# Patient Record
Sex: Male | Born: 1960
Health system: Southern US, Community
[De-identification: ages and names within clinical notes are randomized; demographics above are authoritative.]

---

## 2002-10-24 ENCOUNTER — Emergency Department (HOSPITAL_COMMUNITY): Admission: EM | Admit: 2002-10-24 | Discharge: 2002-10-24 | Payer: Self-pay | Admitting: Emergency Medicine

## 2002-10-24 ENCOUNTER — Encounter: Payer: Self-pay | Admitting: Emergency Medicine

## 2007-04-15 ENCOUNTER — Emergency Department (HOSPITAL_COMMUNITY): Admission: EM | Admit: 2007-04-15 | Discharge: 2007-04-15 | Payer: Self-pay | Admitting: Emergency Medicine

## 2010-09-11 ENCOUNTER — Emergency Department (HOSPITAL_COMMUNITY): Admission: EM | Admit: 2010-09-11 | Discharge: 2010-09-11 | Payer: Self-pay | Admitting: Emergency Medicine

## 2011-01-10 LAB — COMPREHENSIVE METABOLIC PANEL
ALT: 17 U/L (ref 0–53)
AST: 20 U/L (ref 0–37)
Albumin: 4.2 g/dL (ref 3.5–5.2)
Alkaline Phosphatase: 56 U/L (ref 39–117)
BUN: 16 mg/dL (ref 6–23)
CO2: 24 mEq/L (ref 19–32)
Calcium: 9.3 mg/dL (ref 8.4–10.5)
Chloride: 106 mEq/L (ref 96–112)
Creatinine, Ser: 0.94 mg/dL (ref 0.4–1.5)
GFR calc Af Amer: >60 mL/min (ref >60)
GFR calc non Af Amer: >60 mL/min (ref >60)
Glucose, Bld: 119 mg/dL — ABNORMAL HIGH (ref 70–99)
Potassium: 3 mEq/L — ABNORMAL LOW (ref 3.5–5.1)
Sodium: 142 mEq/L (ref 135–145)
Total Bilirubin: 0.3 mg/dL (ref 0.3–1.2)
Total Protein: 7.1 g/dL (ref 6.0–8.3)

## 2011-01-10 LAB — URINE MICROSCOPIC-ADD ON

## 2011-01-10 LAB — ETHANOL: Alcohol, Ethyl (B): 270 mg/dL — ABNORMAL HIGH (ref 0–10)

## 2011-01-10 LAB — DIFFERENTIAL
Basophils Absolute: 0 10*3/uL (ref 0.0–0.1)
Eosinophils Relative: 1 % (ref 0–5)
Lymphocytes Relative: 47 % — ABNORMAL HIGH (ref 12–46)
Lymphs Abs: 3.2 10*3/uL (ref 0.7–4.0)
Neutro Abs: 3 10*3/uL (ref 1.7–7.7)
Neutrophils Relative %: 45 % (ref 43–77)

## 2011-01-10 LAB — CBC
HCT: 42.7 % (ref 39.0–52.0)
Hemoglobin: 13.9 g/dL (ref 13.0–17.0)
MCH: 28 pg (ref 26.0–34.0)
MCHC: 32.6 g/dL (ref 30.0–36.0)
MCV: 85.9 fL (ref 78.0–100.0)
Platelets: 270 10*3/uL (ref 150–400)
RBC: 4.97 MIL/uL (ref 4.22–5.81)
RDW: 12.8 % (ref 11.5–15.5)
WBC: 6.8 10*3/uL (ref 4.0–10.5)

## 2011-01-10 LAB — RAPID URINE DRUG SCREEN, HOSP PERFORMED
Amphetamines: NOT DETECTED
Cocaine: NOT DETECTED
Opiates: NOT DETECTED
Tetrahydrocannabinol: NOT DETECTED

## 2011-01-10 LAB — URINALYSIS, ROUTINE W REFLEX MICROSCOPIC
Glucose, UA: NEGATIVE mg/dL
Ketones, ur: NEGATIVE mg/dL
Leukocytes, UA: NEGATIVE
Nitrite: NEGATIVE
pH: 5 (ref 5.0–8.0)

## 2013-01-10 ENCOUNTER — Other Ambulatory Visit: Payer: Self-pay | Admitting: Neurology

## 2013-01-10 DIAGNOSIS — R42 Dizziness and giddiness: Secondary | ICD-10-CM

## 2013-01-13 ENCOUNTER — Other Ambulatory Visit: Payer: Self-pay | Admitting: Neurology

## 2013-01-13 DIAGNOSIS — R42 Dizziness and giddiness: Secondary | ICD-10-CM

## 2013-01-15 ENCOUNTER — Ambulatory Visit (INDEPENDENT_AMBULATORY_CARE_PROVIDER_SITE_OTHER): Payer: BC Managed Care – PPO

## 2013-01-15 DIAGNOSIS — R42 Dizziness and giddiness: Secondary | ICD-10-CM

## 2013-01-16 ENCOUNTER — Telehealth: Payer: Self-pay | Admitting: Neurology

## 2013-01-16 MED ORDER — GADOPENTETATE DIMEGLUMINE 469.01 MG/ML IV SOLN: 17.0000 mL | Freq: Once | INTRAVENOUS | Status: AC | PRN

## 2013-01-16 NOTE — Procedures (Addendum)
[facility]  NEUROIMAGING REPORT   STUDY DATE: 01/15/13 PATIENT NAME: Troy Goodwin DOB: [birth year] MRN: [id number]  ORDERING CLINICIAN: Levert Feinstein, MD CLINICAL HISTORY: 52 year old male with dizziness. Remote history of right Bell's palsy.  EXAM: MRI brain (with and without)  TECHNIQUE: MRI of the brain with and without contrast was obtained utilizing 5 mm axial slices with T1, T2, T2 flair, T2 star gradient echo and diffusion weighted views.  T1 sagittal, T2 coronal and postcontrast views in the axial and coronal plane were obtained. CONTRAST: no IMAGING SITE: [facility] [address]   FINDINGS:  No abnormal lesions are seen on diffusion-weighted views to suggest acute ischemia. The cortical sulci, fissures and cisterns are normal in size and appearance. Lateral, third and fourth ventricle are normal in size and appearance. Minimal asymmetry of the lateral ventricles (right smaller than left) likely a normal variant. No extra-axial fluid collections are seen. No evidence of mass effect or midline shift.  No abnormal lesions are seen on post contrast views.    On sagittal views the posterior fossa, pituitary gland and corpus callosum are unremarkable. No evidence of intracranial hemorrhage on gradient-echo views. The orbits and their contents, paranasal sinuses and calvarium are unremarkable.  Intracranial flow voids are present.  IMPRESSION:  Normal MRI brain (with and without). No change from CT head (without) on 04/15/07.   INTERPRETING PHYSICIAN:  Suanne Marker, MD Certified in Neurology, Neurophysiology and Neuroimaging  [facility] [address] [place], [place] [address] [phone]

## 2013-01-16 NOTE — Telephone Encounter (Signed)
Please call patient for normal MRI of brain 

## 2013-01-17 NOTE — Telephone Encounter (Signed)
Tried calling both numbers were disconnected. Will send letter to notify patient of results.

## 2013-02-24 ENCOUNTER — Ambulatory Visit: Payer: BC Managed Care – PPO | Admitting: Neurology

## 2015-04-20 ENCOUNTER — Encounter (HOSPITAL_BASED_OUTPATIENT_CLINIC_OR_DEPARTMENT_OTHER): Payer: Self-pay | Admitting: Emergency Medicine

## 2015-04-20 ENCOUNTER — Emergency Department (HOSPITAL_BASED_OUTPATIENT_CLINIC_OR_DEPARTMENT_OTHER)
Admission: EM | Admit: 2015-04-20 | Discharge: 2015-04-20 | Disposition: A | Discharge status: Home / Self Care | Payer: BLUE CROSS/BLUE SHIELD | Attending: Emergency Medicine | Admitting: Emergency Medicine

## 2015-04-20 ENCOUNTER — Emergency Department (HOSPITAL_BASED_OUTPATIENT_CLINIC_OR_DEPARTMENT_OTHER): Payer: BLUE CROSS/BLUE SHIELD

## 2015-04-20 DIAGNOSIS — K273 Acute peptic ulcer, site unspecified, without hemorrhage or perforation: Secondary | ICD-10-CM | POA: Diagnosis not present

## 2015-04-20 DIAGNOSIS — K2970 Gastritis, unspecified, without bleeding: Secondary | ICD-10-CM | POA: Insufficient documentation

## 2015-04-20 DIAGNOSIS — R109 Unspecified abdominal pain: Secondary | ICD-10-CM | POA: Diagnosis present

## 2015-04-20 DIAGNOSIS — K279 Peptic ulcer, site unspecified, unspecified as acute or chronic, without hemorrhage or perforation: Secondary | ICD-10-CM

## 2015-04-20 LAB — CBC WITH DIFFERENTIAL/PLATELET
BASOS ABS: 0 10*3/uL (ref 0.0–0.1)
BASOS PCT: 0 % (ref 0–1)
EOS ABS: 0 10*3/uL (ref 0.0–0.7)
Eosinophils Relative: 1 % (ref 0–5)
HEMATOCRIT: 41.9 % (ref 39.0–52.0)
HEMOGLOBIN: 14.1 g/dL (ref 13.0–17.0)
Lymphocytes Relative: 27 % (ref 12–46)
Lymphs Abs: 1.8 10*3/uL (ref 0.7–4.0)
MCH: 29.6 pg (ref 26.0–34.0)
MCHC: 33.7 g/dL (ref 30.0–36.0)
MCV: 88 fL (ref 78.0–100.0)
MONO ABS: 0.5 10*3/uL (ref 0.1–1.0)
MONOS PCT: 8 % (ref 3–12)
NEUTROS PCT: 64 % (ref 43–77)
Neutro Abs: 4.4 10*3/uL (ref 1.7–7.7)
PLATELETS: 265 10*3/uL (ref 150–400)
RBC: 4.76 MIL/uL (ref 4.22–5.81)
RDW: 12.8 % (ref 11.5–15.5)
WBC: 6.8 10*3/uL (ref 4.0–10.5)

## 2015-04-20 LAB — COMPREHENSIVE METABOLIC PANEL
ALBUMIN: 4.4 g/dL (ref 3.5–5.0)
ALT: 17 U/L (ref 17–63)
AST: 20 U/L (ref 15–41)
Alkaline Phosphatase: 44 U/L (ref 38–126)
Anion gap: 9 (ref 5–15)
BILIRUBIN TOTAL: 0.7 mg/dL (ref 0.3–1.2)
BUN: 18 mg/dL (ref 6–20)
CHLORIDE: 104 mmol/L (ref 101–111)
CO2: 27 mmol/L (ref 22–32)
CREATININE: 0.97 mg/dL (ref 0.61–1.24)
Calcium: 9.3 mg/dL (ref 8.9–10.3)
GFR calc Af Amer: >60 mL/min (ref >60)
GFR calc non Af Amer: >60 mL/min (ref >60)
Glucose, Bld: 95 mg/dL (ref 65–99)
POTASSIUM: 3.9 mmol/L (ref 3.5–5.1)
Sodium: 140 mmol/L (ref 135–145)
TOTAL PROTEIN: 7.3 g/dL (ref 6.5–8.1)

## 2015-04-20 LAB — LIPASE, BLOOD: Lipase: 24 U/L (ref 22–51)

## 2015-04-20 LAB — URINALYSIS, ROUTINE W REFLEX MICROSCOPIC
Bilirubin Urine: NEGATIVE
Glucose, UA: NEGATIVE mg/dL
HGB URINE DIPSTICK: NEGATIVE
Ketones, ur: NEGATIVE mg/dL
LEUKOCYTES UA: NEGATIVE
Nitrite: NEGATIVE
PROTEIN: NEGATIVE mg/dL
SPECIFIC GRAVITY, URINE: 1.012 (ref 1.005–1.030)
UROBILINOGEN UA: 0.2 mg/dL (ref 0.0–1.0)
pH: 6.5 (ref 5.0–8.0)

## 2015-04-20 MED ORDER — ONDANSETRON 4 MG PO TBDP: 4.0000 mg | ORAL_TABLET | Freq: Three times a day (TID) | ORAL | Status: DC | PRN

## 2015-04-20 MED ORDER — OMEPRAZOLE 20 MG PO CPDR: 20.0000 mg | DELAYED_RELEASE_CAPSULE | Freq: Two times a day (BID) | ORAL | Status: DC

## 2015-04-20 MED ORDER — SUCRALFATE 1 G PO TABS: 1.0000 g | ORAL_TABLET | Freq: Four times a day (QID) | ORAL | Status: DC

## 2015-04-20 NOTE — ED Provider Notes (Signed)
CSN: [id number]     Arrival date & time 04/20/15  1724 History   First MD Initiated Contact with Patient 04/20/15 1736     Chief Complaint  Patient presents with  . Abdominal Pain  . Flank Pain     HPI  Complains of right mid abdominal and back pain intermittently for the last 2 weeks. Saw blood in his urine a few weeks ago.  He makes trips to [place] 9 times a year. He returned 2 weeks ago from history it with a GI illness. He should use his symptoms to a meal that was delivered to his workplace. He states he typically does need anything outside of the work place. He had a dysentery-like illness with diarrhea, body aches, joint pain or vomiting. He persisted with some abdominal pain since he has returned. Saw blood in his urine one day. Recheck with urgent care 2 days later had no blood in his urine and normal labs. Continues to have epigastric and right abdominal/right subcostal pain.  History reviewed. No pertinent past medical history. History reviewed. No pertinent past surgical history. History reviewed. No pertinent family history. History  Substance Use Topics  . Smoking status: Never Smoker   . Smokeless tobacco: Never Used  . Alcohol Use: Yes     Comment: occasional    Review of Systems  Constitutional: Negative for fever, chills, diaphoresis, appetite change and fatigue.  HENT: Negative for mouth sores, sore throat and trouble swallowing.   Eyes: Negative for visual disturbance.  Respiratory: Negative for cough, chest tightness, shortness of breath and wheezing.   Cardiovascular: Negative for chest pain.  Gastrointestinal: Positive for abdominal pain. Negative for nausea, vomiting, diarrhea and abdominal distention.  Endocrine: Negative for polydipsia, polyphagia and polyuria.  Genitourinary: Positive for hematuria. Negative for dysuria and frequency.  Musculoskeletal: Negative for gait problem.  Skin: Negative for color change, pallor and rash.  Neurological: Negative  for dizziness, syncope, light-headedness and headaches.  Hematological: Does not bruise/bleed easily.  Psychiatric/Behavioral: Negative for behavioral problems and confusion.      Allergies  Review of patient's allergies indicates no known allergies.  Home Medications   Prior to Admission medications   Medication Sig Start Date End Date Taking? Authorizing Provider  omeprazole (PRILOSEC) 20 MG capsule Take 1 capsule (20 mg total) by mouth 2 (two) times daily. 04/20/15   Rolland Porter, MD  ondansetron (ZOFRAN ODT) 4 MG disintegrating tablet Take 1 tablet (4 mg total) by mouth every 8 (eight) hours as needed for nausea. 04/20/15   Rolland Porter, MD  sucralfate (CARAFATE) 1 G tablet Take 1 tablet (1 g total) by mouth 4 (four) times daily. 04/20/15   Rolland Porter, MD   BP 126/87 mmHg  Pulse 63  Temp(Src) 98.5 F (36.9 C) (Oral)  Resp 18  Ht 6\' 2"  (1.88 m)  Wt 177 lb (80.287 kg)  BMI 22.72 kg/m2  SpO2 100% Physical Exam  Constitutional: He is oriented to person, place, and time. He appears well-developed and well-nourished. No distress.  HENT:  Head: Normocephalic.  Eyes: Conjunctivae are normal. Pupils are equal, round, and reactive to light. No scleral icterus.  Neck: Normal range of motion. Neck supple. No thyromegaly present.  Cardiovascular: Normal rate and regular rhythm.  Exam reveals no gallop and no friction rub.   No murmur heard. Pulmonary/Chest: Effort normal and breath sounds normal. No respiratory distress. He has no wheezes. He has no rales.  Abdominal: Soft. Bowel sounds are normal. He exhibits no distension.  There is tenderness. There is no rebound.    Musculoskeletal: Normal range of motion.  Neurological: He is alert and oriented to person, place, and time.  Skin: Skin is warm and dry. No rash noted.  Psychiatric: He has a normal mood and affect. His behavior is normal.    ED Course  Procedures (including critical care time) Labs Review Labs Reviewed  CBC WITH  DIFFERENTIAL/PLATELET  COMPREHENSIVE METABOLIC PANEL  LIPASE, BLOOD  URINALYSIS, ROUTINE W REFLEX MICROSCOPIC (NOT AT [facility])    Imaging Review Ct Renal Stone Study  04/20/2015   CLINICAL DATA:  Patient with right upper quadrant and right flank pain for 2 weeks. Fever, chills, nausea, vomiting and diarrhea.  EXAM: CT ABDOMEN AND PELVIS WITHOUT CONTRAST  TECHNIQUE: Multidetector CT imaging of the abdomen and pelvis was performed following the standard protocol without IV contrast.  COMPARISON:  None.  FINDINGS: Lower chest: No consolidative or nodular pulmonary opacities. No pleural effusion. Normal heart size.  Hepatobiliary: Liver is normal in size and contour. Sub cm low-attenuation lesions within the left hepatic lobe (image 14; series 2) and right hepatic lobe, too small to accurately characterize. Gallbladder is unremarkable. No intrahepatic or extrahepatic biliary ductal dilatation.  Pancreas: Unremarkable  Spleen: Unremarkable  Adrenals/Urinary Tract: Adrenal glands are normal. No nephrolithiasis. 3 mm calcification along the inferior left aspect of the urinary bladder (image 42; series 5) favored to be along the external margin of bladder wall.  Stomach/Bowel: No abnormal bowel wall thickening or evidence for bowel obstruction. The appendix is normal. No free fluid or free intraperitoneal air.  Vascular/Lymphatic: Normal caliber abdominal aorta. No retroperitoneal lymphadenopathy.  Other: Prostate is unremarkable.  Musculoskeletal: Punctate sclerotic foci within the right proximal femur and ischium favored to represent bone islands. No aggressive or acute appearing osseous lesions.  IMPRESSION: No acute process within the abdomen or pelvis.  Normal appendix.   Electronically Signed   By: Annia Belt M.D.   On: 04/20/2015 18:55   US Abdomen Limited Ruq  04/20/2015   CLINICAL DATA:  RIGHT lower quadrant pain and RIGHT flank pain. RIGHT upper quadrant pain.  EXAM: US ABDOMEN LIMITED - RIGHT UPPER  QUADRANT  COMPARISON:  04/20/2015.  FINDINGS: Gallbladder:  Tiny gallbladder polyp along the posterior wall. 3 mm gallstone at the neck of the gallbladder. This stone is too small to shadow. No wall thickening. No pericholecystic fluid. No sonographic Murphy sign.  Common bile duct:  Diameter: 4 mm, normal.  Liver:  No focal lesion identified. Within normal limits in parenchymal echogenicity.  IMPRESSION: Cholelithiasis without cholecystitis.   Electronically Signed   By: Andreas Newport M.D.   On: 04/20/2015 20:26     EKG Interpretation None      MDM   Final diagnoses:  Flank pain  AP (abdominal pain)  Gastritis  Peptic ulcer disease    CT scan does not show kidney stones. No other abnormalities noted. Ultrasound shows small 3 mm stone. No ductal dilatation. Normal hepatobiliary enzymes. No leukocytosis. [place] and think he is appropriate for outpatient treatment. This may be simple lingering gastritis/duodenitis secondary to his infectious illness 2 weeks ago. Plans proton pump inhibitor, Carafate, small frequent meals, expected management.      Rolland Porter, MD 04/20/15 2121

## 2015-04-20 NOTE — ED Notes (Signed)
Pt in c/o RLQ and R flank pain x 2 weeks. States had hematuria x 2 weeks ago, diarrhea x 2 weeks ago which resolved. Pt has been seen twice, had UAs that did not reveal UTI.

## 2015-04-20 NOTE — Discharge Instructions (Signed)
Small frequent meals. Medications as prescribed. Avoid alcohol, tobacco, anti-inflammatory Medications, and Caffeine.  Abdominal Pain Many things can cause abdominal pain. Usually, abdominal pain is not caused by a disease and will improve without treatment. It can often be observed and treated at home. Your health care provider will do a physical exam and possibly order blood tests and X-rays to help determine the seriousness of your pain. However, in many cases, more time must pass before a clear cause of the pain can be found. Before that point, your health care provider may not know if you need more testing or further treatment. HOME CARE INSTRUCTIONS  Monitor your abdominal pain for any changes. The following actions may help to alleviate any discomfort you are experiencing:  Only take over-the-counter or prescription medicines as directed by your health care provider.  Do not take laxatives unless directed to do so by your health care provider.  Try a clear liquid diet (broth, tea, or water) as directed by your health care provider. Slowly move to a bland diet as tolerated. SEEK MEDICAL CARE IF:  You have unexplained abdominal pain.  You have abdominal pain associated with nausea or diarrhea.  You have pain when you urinate or have a bowel movement.  You experience abdominal pain that wakes you in the night.  You have abdominal pain that is worsened or improved by eating food.  You have abdominal pain that is worsened with eating fatty foods.  You have a fever. SEEK IMMEDIATE MEDICAL CARE IF:   Your pain does not go away within 2 hours.  You keep throwing up (vomiting).  Your pain is felt only in portions of the abdomen, such as the right side or the left lower portion of the abdomen.  You pass bloody or black tarry stools. MAKE SURE YOU:  Understand these instructions.   Will watch your condition.   Will get help right away if you are not doing well or get worse.   Document Released: 07/26/2005 Document Revised: 10/21/2013 Document Reviewed: 06/25/2013 [facility] Patient Information 2015 [place], [place]. This information is not intended to replace advice given to you by your health care provider. Make sure you discuss any questions you have with your health care provider.  Gastritis, Adult Gastritis is soreness and puffiness (inflammation) of the lining of the stomach. If you do not get help, gastritis can cause bleeding and sores (ulcers) in the stomach. HOME CARE   Only take medicine as told by your doctor.  If you were given antibiotic medicines, take them as told. Finish the medicines even if you start to feel better.  Drink enough fluids to keep your pee (urine) clear or pale yellow.  Avoid foods and drinks that make your problems worse. Foods you may want to avoid include:  Caffeine or alcohol.  Chocolate.  Mint.  Garlic and onions.  Spicy foods.  Citrus fruits, including oranges, lemons, or limes.  Food containing tomatoes, including sauce, chili, salsa, and pizza.  Fried and fatty foods.  Eat small meals throughout the day instead of large meals. GET HELP RIGHT AWAY IF:   You have black or dark red poop (stools).  You throw up (vomit) blood. It may look like coffee grounds.  You cannot keep fluids down.  Your belly (abdominal) pain gets worse.  You have a fever.  You do not feel better after 1 week.  You have any other questions or concerns. MAKE SURE YOU:   Understand these instructions.  Will watch  your condition.  Will get help right away if you are not doing well or get worse. Document Released: 04/03/2008 Document Revised: 01/08/2012 Document Reviewed: 11/29/2011 [facility] Patient Information 2015 [place], [place]. This information is not intended to replace advice given to you by your health care provider. Make sure you discuss any questions you have with your health care provider.  Peptic Ulcer A peptic  ulcer is a sore in the lining of your esophagus (esophageal ulcer), stomach (gastric ulcer), or in the first part of your small intestine (duodenal ulcer). The ulcer causes erosion into the deeper tissue. CAUSES  Normally, the lining of the stomach and the small intestine protects itself from the acid that digests food. The protective lining can be damaged by:  An infection caused by a bacterium called Helicobacter pylori (H. pylori).  Regular use of nonsteroidal anti-inflammatory drugs (NSAIDs), such as ibuprofen or aspirin.  Smoking tobacco. Other risk factors include being older than 50, drinking alcohol excessively, and having a family history of ulcer disease.  SYMPTOMS   Burning pain or gnawing in the area between the chest and the belly button.  Heartburn.  Nausea and vomiting.  Bloating. The pain can be worse on an empty stomach and at night. If the ulcer results in bleeding, it can cause:  Black, tarry stools.  Vomiting of bright red blood.  Vomiting of coffee-ground-looking materials. DIAGNOSIS  A diagnosis is usually made based upon your history and an exam. Other tests and procedures may be performed to find the cause of the ulcer. Finding a cause will help determine the best treatment. Tests and procedures may include:  Blood tests, stool tests, or breath tests to check for the bacterium H. pylori.  An upper gastrointestinal (GI) series of the esophagus, stomach, and small intestine.  An endoscopy to examine the esophagus, stomach, and small intestine.  A biopsy. TREATMENT  Treatment may include:  Eliminating the cause of the ulcer, such as smoking, NSAIDs, or alcohol.  Medicines to reduce the amount of acid in your digestive tract.  Antibiotic medicines if the ulcer is caused by the H. pylori bacterium.  An upper endoscopy to treat a bleeding ulcer.  Surgery if the bleeding is severe or if the ulcer created a hole somewhere in the digestive system. HOME  CARE INSTRUCTIONS   Avoid tobacco, alcohol, and caffeine. Smoking can increase the acid in the stomach, and continued smoking will impair the healing of ulcers.  Avoid foods and drinks that seem to cause discomfort or aggravate your ulcer.  Only take medicines as directed by your caregiver. Do not substitute over-the-counter medicines for prescription medicines without talking to your caregiver.  Keep any follow-up appointments and tests as directed. SEEK MEDICAL CARE IF:   Your do not improve within 7 days of starting treatment.  You have ongoing indigestion or heartburn. SEEK IMMEDIATE MEDICAL CARE IF:   You have sudden, sharp, or persistent abdominal pain.  You have bloody or dark black, tarry stools.  You vomit blood or vomit that looks like coffee grounds.  You become light-headed, weak, or feel faint.  You become sweaty or clammy. MAKE SURE YOU:   Understand these instructions.  Will watch your condition.  Will get help right away if you are not doing well or get worse. Document Released: 10/13/2000 Document Revised: 03/02/2014 Document Reviewed: 05/15/2012 [facility] Patient Information 2015 [place], [place]. This information is not intended to replace advice given to you by your health care provider. Make sure you discuss  any questions you have with your health care provider. ° °

## 2017-03-23 DIAGNOSIS — E785 Hyperlipidemia, unspecified: Secondary | ICD-10-CM | POA: Diagnosis not present

## 2017-03-23 DIAGNOSIS — J309 Allergic rhinitis, unspecified: Secondary | ICD-10-CM | POA: Diagnosis not present

## 2017-03-23 DIAGNOSIS — K219 Gastro-esophageal reflux disease without esophagitis: Secondary | ICD-10-CM | POA: Diagnosis not present

## 2017-03-23 DIAGNOSIS — S39012A Strain of muscle, fascia and tendon of lower back, initial encounter: Secondary | ICD-10-CM | POA: Diagnosis not present

## 2017-03-30 DIAGNOSIS — E785 Hyperlipidemia, unspecified: Secondary | ICD-10-CM | POA: Diagnosis not present

## 2017-03-30 DIAGNOSIS — Z0000 Encounter for general adult medical examination without abnormal findings: Secondary | ICD-10-CM | POA: Diagnosis not present

## 2017-03-30 DIAGNOSIS — J309 Allergic rhinitis, unspecified: Secondary | ICD-10-CM | POA: Diagnosis not present

## 2017-03-30 DIAGNOSIS — K219 Gastro-esophageal reflux disease without esophagitis: Secondary | ICD-10-CM | POA: Diagnosis not present

## 2017-03-30 DIAGNOSIS — Z01118 Encounter for examination of ears and hearing with other abnormal findings: Secondary | ICD-10-CM | POA: Diagnosis not present

## 2017-03-30 DIAGNOSIS — Z136 Encounter for screening for cardiovascular disorders: Secondary | ICD-10-CM | POA: Diagnosis not present

## 2017-03-30 DIAGNOSIS — Z131 Encounter for screening for diabetes mellitus: Secondary | ICD-10-CM | POA: Diagnosis not present

## 2018-09-09 DIAGNOSIS — Z131 Encounter for screening for diabetes mellitus: Secondary | ICD-10-CM | POA: Diagnosis not present

## 2018-09-09 DIAGNOSIS — Z0000 Encounter for general adult medical examination without abnormal findings: Secondary | ICD-10-CM | POA: Diagnosis not present

## 2018-09-09 DIAGNOSIS — K219 Gastro-esophageal reflux disease without esophagitis: Secondary | ICD-10-CM | POA: Diagnosis not present

## 2018-09-09 DIAGNOSIS — E782 Mixed hyperlipidemia: Secondary | ICD-10-CM | POA: Diagnosis not present

## 2018-09-09 DIAGNOSIS — Z01118 Encounter for examination of ears and hearing with other abnormal findings: Secondary | ICD-10-CM | POA: Diagnosis not present

## 2018-09-09 DIAGNOSIS — Z125 Encounter for screening for malignant neoplasm of prostate: Secondary | ICD-10-CM | POA: Diagnosis not present

## 2018-09-09 DIAGNOSIS — Z136 Encounter for screening for cardiovascular disorders: Secondary | ICD-10-CM | POA: Diagnosis not present

## 2018-09-09 DIAGNOSIS — J309 Allergic rhinitis, unspecified: Secondary | ICD-10-CM | POA: Diagnosis not present

## 2018-09-10 DIAGNOSIS — Z0000 Encounter for general adult medical examination without abnormal findings: Secondary | ICD-10-CM | POA: Diagnosis not present

## 2018-10-07 DIAGNOSIS — K219 Gastro-esophageal reflux disease without esophagitis: Secondary | ICD-10-CM | POA: Diagnosis not present

## 2018-10-07 DIAGNOSIS — E782 Mixed hyperlipidemia: Secondary | ICD-10-CM | POA: Diagnosis not present

## 2018-10-07 DIAGNOSIS — R05 Cough: Secondary | ICD-10-CM | POA: Diagnosis not present

## 2018-10-07 DIAGNOSIS — J209 Acute bronchitis, unspecified: Secondary | ICD-10-CM | POA: Diagnosis not present

## 2020-05-22 ENCOUNTER — Other Ambulatory Visit: Payer: Self-pay

## 2020-05-22 ENCOUNTER — Ambulatory Visit (INDEPENDENT_AMBULATORY_CARE_PROVIDER_SITE_OTHER): Payer: 59

## 2020-05-22 ENCOUNTER — Ambulatory Visit (HOSPITAL_COMMUNITY)
Admission: EM | Admit: 2020-05-22 | Discharge: 2020-05-22 | Disposition: A | Discharge status: Home / Self Care | Payer: 59 | Attending: Family Medicine | Admitting: Family Medicine

## 2020-05-22 ENCOUNTER — Encounter (HOSPITAL_COMMUNITY): Payer: Self-pay | Admitting: *Deleted

## 2020-05-22 DIAGNOSIS — M79661 Pain in right lower leg: Secondary | ICD-10-CM | POA: Diagnosis not present

## 2020-05-22 DIAGNOSIS — M79604 Pain in right leg: Secondary | ICD-10-CM

## 2020-05-22 DIAGNOSIS — M7989 Other specified soft tissue disorders: Secondary | ICD-10-CM

## 2020-05-22 NOTE — Discharge Instructions (Signed)
Take the ibuprofen as prescribed.  Rest and elevate your hand.  Apply ice packs 2-3 times a day for up to 20 minutes each.   ° °Follow up with your primary care provider or an orthopedist if you symptoms continue or worsen;  Or if you develop new symptoms, such as numbness, tingling, or weakness.   ° °

## 2020-05-22 NOTE — ED Triage Notes (Signed)
Reports having an extension ladder fall onto RLE, just below knee, approx 1 hr ago.  Large area swelling noted.  RLE CMS intact.

## 2020-05-26 NOTE — ED Provider Notes (Signed)
[facility]   [id number] 05/22/20 Arrival Time: 1653  LF:YBOFB PAIN  SUBJECTIVE: History from: patient. Troy Goodwin is a 59 y.o. male complains of right leg pain since about an hour ago. Reports that a ladder fell onto his right lower leg just below his knee. Describes the area as swollen and very tender to touch. Has not taken OTC medications for this. There are not aggravating or relieving factors. Denies similar symptoms in the past. Denies fever, chills, erythema, ecchymosis, weakness, numbness and tingling, saddle paresthesias, loss of bowel or bladder function.      ROS: As per HPI.  All other pertinent ROS negative.     History reviewed. No pertinent past medical history. History reviewed. No pertinent surgical history. No Known Allergies No current facility-administered medications on file prior to encounter.   Current Outpatient Medications on File Prior to Encounter  Medication Sig Dispense Refill  . omeprazole (PRILOSEC) 20 MG capsule Take 1 capsule (20 mg total) by mouth 2 (two) times daily. 60 capsule 0  . ondansetron (ZOFRAN ODT) 4 MG disintegrating tablet Take 1 tablet (4 mg total) by mouth every 8 (eight) hours as needed for nausea. 20 tablet 0  . sucralfate (CARAFATE) 1 G tablet Take 1 tablet (1 g total) by mouth 4 (four) times daily. 60 tablet 0   Social History   Socioeconomic History  . Marital status: Married    Spouse name: Not on file  . Number of children: Not on file  . Years of education: Not on file  . Highest education level: Not on file  Occupational History  . Not on file  Tobacco Use  . Smoking status: Never Smoker  . Smokeless tobacco: Never Used  Vaping Use  . Vaping Use: Never used  Substance and Sexual Activity  . Alcohol use: Yes    Comment: occasional  . Drug use: No  . Sexual activity: Not on file  Other Topics Concern  . Not on file  Social History Narrative  . Not on file   Social Determinants of Health    Financial Resource Strain:   . Difficulty of Paying Living Expenses:   Food Insecurity:   . Worried About Programme researcher, broadcasting/film/video in the Last Year:   . Barista in the Last Year:   Transportation Needs:   . Freight forwarder (Medical):   Marland Kitchen Lack of Transportation (Non-Medical):   Physical Activity:   . Days of Exercise per Week:   . Minutes of Exercise per Session:   Stress:   . Feeling of Stress :   Social Connections:   . Frequency of Communication with Friends and Family:   . Frequency of Social Gatherings with Friends and Family:   . Attends Religious Services:   . Active Member of Clubs or Organizations:   . Attends Banker Meetings:   Marland Kitchen Marital Status:   Intimate Partner Violence:   . Fear of Current or Ex-Partner:   . Emotionally Abused:   Marland Kitchen Physically Abused:   . Sexually Abused:    Family History  Problem Relation Age of Onset  . Cancer Mother   . Cancer Father     OBJECTIVE:  Vitals:   05/22/20 1720  BP: (!) 150/95  Pulse: 85  Resp: 18  Temp: 98.5 F (36.9 C)  TempSrc: Oral  SpO2: 100%    General appearance: ALERT; in no acute distress.  Head: NCAT Lungs: Normal respiratory effort CV:  pulses  2+ bilaterally. Cap refill < 2 seconds Musculoskeletal:  Inspection: Skin warm, dry, clear and intact without obvious erythema, ecchymosis. Obvious effusion at right proximal tibia Palpation:right proximal tibua tender to palpation ROM: FROM active and passive Stability: Anterior/ posterior drawer intact Skin: warm and dry Neurologic: Ambulates without difficulty; Sensation intact about the upper/ lower extremities Psychological: alert and cooperative; normal mood and affect  DIAGNOSTIC STUDIES:  No results found.   ASSESSMENT & PLAN:  1. Right leg pain     No orders of the defined types were placed in this encounter.  Xray negative Continue conservative management of rest, ice, and gentle stretches Take naproxen as needed  for pain relief (may cause abdominal discomfort, ulcers, and GI bleeds avoid taking with other NSAIDs) Follow up with PCP if symptoms persist Return or go to the ER if you have any new or worsening symptoms (fever, chills, chest pain, abdominal pain, changes in bowel or bladder habits, pain radiating into lower legs)   Reviewed expectations re: course of current medical issues. Questions answered. Outlined signs and symptoms indicating need for more acute intervention. Patient verbalized understanding. After Visit Summary given.       Moshe Cipro, NP 05/26/20 1519

## 2021-05-14 ENCOUNTER — Emergency Department (HOSPITAL_BASED_OUTPATIENT_CLINIC_OR_DEPARTMENT_OTHER)
Admission: EM | Admit: 2021-05-14 | Discharge: 2021-05-14 | Disposition: A | Discharge status: Home / Self Care | Payer: 59 | Attending: Emergency Medicine | Admitting: Emergency Medicine

## 2021-05-14 ENCOUNTER — Emergency Department (HOSPITAL_BASED_OUTPATIENT_CLINIC_OR_DEPARTMENT_OTHER): Payer: 59

## 2021-05-14 ENCOUNTER — Encounter (HOSPITAL_BASED_OUTPATIENT_CLINIC_OR_DEPARTMENT_OTHER): Payer: Self-pay | Admitting: Emergency Medicine

## 2021-05-14 ENCOUNTER — Other Ambulatory Visit: Payer: Self-pay

## 2021-05-14 DIAGNOSIS — M545 Low back pain, unspecified: Secondary | ICD-10-CM | POA: Diagnosis present

## 2021-05-14 MED ORDER — PREDNISONE 10 MG (21) PO TBPK: ORAL_TABLET | Freq: Every day | ORAL | 0 refills | Status: AC

## 2021-05-14 MED ORDER — METHOCARBAMOL 500 MG PO TABS: 500.0000 mg | ORAL_TABLET | Freq: Two times a day (BID) | ORAL | 0 refills | Status: DC

## 2021-05-14 MED ORDER — ACETAMINOPHEN 500 MG PO TABS
1000.0000 mg | ORAL_TABLET | Freq: Once | ORAL | Status: AC
  Administered 2021-05-14: 1000 mg via ORAL
  Filled 2021-05-14: qty 2

## 2021-05-14 MED ORDER — DICLOFENAC SODIUM 1 % EX GEL: 4.0000 g | Freq: Four times a day (QID) | CUTANEOUS | 0 refills | Status: DC

## 2021-05-14 MED ORDER — KETOROLAC TROMETHAMINE 30 MG/ML IJ SOLN
30.0000 mg | Freq: Once | INTRAMUSCULAR | Status: AC
  Administered 2021-05-14: 30 mg via INTRAMUSCULAR
  Filled 2021-05-14: qty 1

## 2021-05-14 NOTE — ED Triage Notes (Signed)
Pt c/o severe back pain after bending over today; pain increases with movement and trying to stand

## 2021-05-14 NOTE — ED Provider Notes (Signed)
[facility] EMERGENCY DEPARTMENT Provider Note   CSN: [id number] Arrival date & time: 05/14/21  1158     History Chief Complaint  Patient presents with   Back Pain    Troy Goodwin is a 60 y.o. male.  HPI Patient is a healthy 60 year old male on no medications other than occasionally uses prednisone because of Bell's palsy related right facial weakness  Patient states that 11 AM this morning he bent over to push a button and while straightening out had sudden onset of severe low back pain.  States is achy and constant denies any radiation down either leg.  States that he then got down to the ground slowly.  Denies any significant fall denies any head injury or loss of consciousness nausea or vomiting.  Denies any pain in either leg or lower extremity.  No bowel or bladder incontinence no numbness or weakness of either leg.  States he has some pain and some difficulty walking as result of pain but denies any disequilibrium or instability in his walk.  No history of cancer or IV drug use.  Does intermittently use prednisone.  No history of fractures however.    History reviewed. No pertinent past medical history.  There are no problems to display for this patient.   History reviewed. No pertinent surgical history.     Family History  Problem Relation Age of Onset   Cancer Mother    Cancer Father     Social History   Tobacco Use   Smoking status: Never   Smokeless tobacco: Never  Vaping Use   Vaping Use: Never used  Substance Use Topics   Alcohol use: Yes    Comment: occasional   Drug use: No    Home Medications Prior to Admission medications   Medication Sig Start Date End Date Taking? Authorizing Provider  diclofenac Sodium (VOLTAREN) 1 % GEL Apply 4 g topically 4 (four) times daily. 05/14/21  Yes Regis Wiland S, PA  methocarbamol (ROBAXIN) 500 MG tablet Take 1 tablet (500 mg total) by mouth 2 (two) times daily. 05/14/21  Yes Keldrick Pomplun S,  PA  predniSONE (STERAPRED UNI-PAK 21 TAB) 10 MG (21) TBPK tablet Take by mouth daily. Take 6 tabs by mouth daily  for 2 days, then 5 tabs for 2 days, then 4 tabs for 2 days, then 3 tabs for 2 days, 2 tabs for 2 days, then 1 tab by mouth daily for 2 days 05/14/21  Yes Tarry Blayney S, PA  omeprazole (PRILOSEC) 20 MG capsule Take 1 capsule (20 mg total) by mouth 2 (two) times daily. 04/20/15   Rolland Porter, MD  ondansetron (ZOFRAN ODT) 4 MG disintegrating tablet Take 1 tablet (4 mg total) by mouth every 8 (eight) hours as needed for nausea. 04/20/15   Rolland Porter, MD  sucralfate (CARAFATE) 1 G tablet Take 1 tablet (1 g total) by mouth 4 (four) times daily. 04/20/15   Rolland Porter, MD    Allergies    Patient has no known allergies.  Review of Systems   Review of Systems  Constitutional:  Negative for chills and fever.  HENT:  Negative for congestion.   Eyes:  Negative for pain.  Respiratory:  Negative for cough and shortness of breath.   Cardiovascular:  Negative for chest pain and leg swelling.  Gastrointestinal:  Negative for abdominal pain, diarrhea, nausea and vomiting.  Genitourinary:  Negative for dysuria.  Musculoskeletal:  Positive for back pain. Negative for myalgias.  Skin:  Negative for rash.  Neurological:  Negative for dizziness and headaches.   Physical Exam Updated Vital Signs BP (!) 142/92 (BP Location: Right Arm)   Pulse 81   Temp 98.4 F (36.9 C) (Oral)   Resp 16   Ht 6\' 2"  (1.88 m)   Wt 83.9 kg   SpO2 100%   BMI 23.75 kg/m   Physical Exam Vitals and nursing note reviewed.  Constitutional:      General: He is not in acute distress. HENT:     Head: Normocephalic and atraumatic.     Nose: Nose normal.  Eyes:     General: No scleral icterus. Cardiovascular:     Rate and Rhythm: Normal rate and regular rhythm.     Pulses: Normal pulses.     Heart sounds: Normal heart sounds.  Pulmonary:     Effort: Pulmonary effort is normal. No respiratory distress.      Breath sounds: No wheezing.  Abdominal:     Palpations: Abdomen is soft.     Tenderness: There is no abdominal tenderness. There is no guarding or rebound.  Musculoskeletal:     Cervical back: Normal range of motion.     Right lower leg: No edema.     Left lower leg: No edema.     Comments: No midline tenderness to palpation of the C, T, L-spine.  Some mild left-sided paralumbar muscular tenderness.  Full range of motion of bilateral lower extremities with good strength bilaterally.  Skin:    General: Skin is warm and dry.     Capillary Refill: Capillary refill takes less than 2 seconds.  Neurological:     Mental Status: He is alert. Mental status is at baseline.  Psychiatric:        Mood and Affect: Mood normal.        Behavior: Behavior normal.    ED Results / Procedures / Treatments   Labs (all labs ordered are listed, but only abnormal results are displayed) Labs Reviewed - No data to display  EKG None  Radiology DG Lumbar Spine Complete  Result Date: 05/14/2021 CLINICAL DATA:  Severe low back pain after bending over today. The pain is increased with movement and trying to stand. EXAM: LUMBAR SPINE - COMPLETE 4+ VIEW COMPARISON:  None. FINDINGS: Five non-rib-bearing lumbar vertebrae. Minimal anterior spur formation at the L4-5 level. Mild anterior spur formation at the T10-11 and T11-12 levels. No fractures, pars defects or subluxations. IMPRESSION: 1. No acute abnormality. 2. Mild degenerative changes. Electronically Signed   By: 05/16/2021 M.D.   On: 05/14/2021 13:57    Procedures Procedures   Medications Ordered in ED Medications  ketorolac (TORADOL) 30 MG/ML injection 30 mg (30 mg Intramuscular Given 05/14/21 1350)  acetaminophen (TYLENOL) tablet 1,000 mg (1,000 mg Oral Given 05/14/21 1350)    ED Course  I have reviewed the triage vital signs and the nursing notes.  Pertinent labs & imaging results that were available during my care of the patient were reviewed by  me and considered in my medical decision making (see chart for details).    MDM Rules/Calculators/A&P                          Patient is a 60 year old male with bells palsy occasionally uses prednisone.  Noted blood thinners.  Not on any other medications.  Broad differential for back pain considered includes malignancy, disc herniation, spinal epidural abscess, spinal fracture, cauda equina, pyelonephritis,  kidney stone, AAA, AD, pancreatitis, PE and PTX.   History without symptoms of urinary or stool retention or incontinence, neurologic changes such as sensation change or weakness lower extremities, coagulopathy or blood thinner use, is not elderly or with history of osteoporosis, denies any history of cancer, fever, IV drug use, weight changes (unexplained), or prolonged steroid use.   Physical exam most consistent with muscular strain versus partial disc herniation. Doubt cauda equina or clinically significant disc herniation d/t lack of saddle anesthesia/bowel or bladder incontinence or urinary retention, normal gait and reassuring physical examination without neurologic deficits.   History is not supportive of kidney stone, AAA, AD, pancreatitis, PE or PTX. Patient has no CVA tenderness or urinary sx to suggest pyelonephritis or kidney stone.   Will manage patient conservatively at this time. NSAIDs, back exercises/stretches, heat therapy and follow up with PCP if symptoms do not resolve in 3-4 weeks. Patient offered muscle relaxer for comfort at night. Counseled on need to return to ED for fever, worsening or concerning symptoms. Patient agreeable to plan and states understanding of follow up plans and return precautions.      Vitals WNL at time of discharge and patient is no acute distress   Offered Toradol shot    Patient is overall quite well-appearing.  X-ray without abnormalities.  There are no bony lesions or fractures evident.  I have low suspicion for fracture given the  atraumatic nature of his back pain.  Patient feels improved after Tylenol and Toradol injection.  Will discharge home with follow-up with sports medicine, PCP.  Final Clinical Impression(s) / ED Diagnoses Final diagnoses:  Acute low back pain without sciatica, unspecified back pain laterality    Rx / DC Orders ED Discharge Orders          Ordered    methocarbamol (ROBAXIN) 500 MG tablet  2 times daily        05/14/21 1453    predniSONE (STERAPRED UNI-PAK 21 TAB) 10 MG (21) TBPK tablet  Daily        05/14/21 1453    diclofenac Sodium (VOLTAREN) 1 % GEL  4 times daily        05/14/21 1453             Gailen Shelter, [place] 05/14/21 1515    Gwyneth Sprout, MD 05/15/21 2218

## 2021-05-14 NOTE — Discharge Instructions (Addendum)
Back Pain: Your x-ray was reassuringly without any abnormalities.  As we discussed this would not show a herniated disc which is the most likely cause of your symptoms. Please return to the ER for any new or concerning symptoms specifically fevers, weakness in your legs, numbness/complete lack of sensation in your legs or between your legs.  Peeing or pooping without the ability to control this.  I have also given you the information for Dr. Clare Gandy of sports medicine who may be very helpful in terms of controlling her back pain.  Back pain is very common.  The pain often gets better over time.  The cause of back pain is usually not dangerous.  Most people can learn to manage their back pain on their own.  However if you develop severe or worsening pain, low back pain with fever, numbness, weakness or inability to walk or urinate/stool, you should return to the ER immediately.  Please follow up with your doctor this week for a recheck if still having symptoms.  Low back pain is discomfort in the lower back that may be due to injuries to muscles and ligaments around the spine.  Occasionally, it may be caused by a a problem to a part of the spine called a disc. The pain may last several days or a week;  However, most patients get completely well in 4 weeks.  Medications are also useful to help with pain control.  A commonly prescribed medications includes acetaminophen.  This medication is generally safe, though you should not take more than 8 of the extra strength (500mg ) pills a day.  Non steroidal anti inflammatory medications including Ibuprofen and naproxen;  These medications help both pain and swelling and are very useful in treating back pain.  They should be taken with food, as they can cause stomach upset, and more seriously, stomach bleeding.    Be aware that if you develop new symptoms, such as a fever, leg weakness, difficulty with or loss of control of your urine or bowels, abdominal  pain, or more severe pain, you will need to seek medical attention and  / or return to the Emergency department.    Home Care Stay active.  Start with short walks on flat ground if you can.  Try to walk farther each day. Do not sit, drive or stand in one place for more than 30 minutes.  Do not stay in bed. Do not avoid exercise or work.  Activity can help your back heal faster. Be careful when you bend or lift an object.  Bend at your knees, keep the object close to you, and do not twist. Sleep on a firm mattress.  Lie on your side, and bend your knees.  If you lie on your back, put a pillow under your knees. Only take medicines as told by your doctor. Put ice on the injured area. Put ice in a plastic bag Place a towel between your skin and the bag Leave the ice on for 15-20 minutes, 3-4 times a day for the first 2-3 days. 210 After that, you can switch between ice and heat packs. Ask your doctor about back exercises or massage. Avoid feeling anxious or stressed.  Find good ways to deal with stress, such as exercise.  Get Help Right Way If: Your pain does not go away with rest or medicine. Your pain does not go away in 1 week. You have new problems. You do not feel well. The pain spreads into your  legs. You cannot control when you poop (bowel movement) or pee (urinate) You feel sick to your stomach (nauseous) or throw up (vomit) You have belly (abdominal) pain. You feel like you may pass out (faint). If you develop a fever.  Make Sure you: Understand these instructions. Watch your condition Get help right away if you are not doing well or get worse.

## 2021-10-10 DIAGNOSIS — R051 Acute cough: Secondary | ICD-10-CM | POA: Diagnosis not present

## 2022-04-06 DIAGNOSIS — M5441 Lumbago with sciatica, right side: Secondary | ICD-10-CM | POA: Diagnosis not present

## 2022-04-06 DIAGNOSIS — Z125 Encounter for screening for malignant neoplasm of prostate: Secondary | ICD-10-CM | POA: Diagnosis not present

## 2022-04-06 DIAGNOSIS — Z131 Encounter for screening for diabetes mellitus: Secondary | ICD-10-CM | POA: Diagnosis not present

## 2022-04-06 DIAGNOSIS — Z1329 Encounter for screening for other suspected endocrine disorder: Secondary | ICD-10-CM | POA: Diagnosis not present

## 2022-04-06 DIAGNOSIS — Z0001 Encounter for general adult medical examination with abnormal findings: Secondary | ICD-10-CM | POA: Diagnosis not present

## 2022-04-06 DIAGNOSIS — Z136 Encounter for screening for cardiovascular disorders: Secondary | ICD-10-CM | POA: Diagnosis not present

## 2022-05-16 DIAGNOSIS — M5441 Lumbago with sciatica, right side: Secondary | ICD-10-CM | POA: Diagnosis not present

## 2022-05-29 DIAGNOSIS — M47816 Spondylosis without myelopathy or radiculopathy, lumbar region: Secondary | ICD-10-CM | POA: Diagnosis not present

## 2022-05-29 DIAGNOSIS — X500XXA Overexertion from strenuous movement or load, initial encounter: Secondary | ICD-10-CM | POA: Diagnosis not present

## 2022-05-30 DIAGNOSIS — M5441 Lumbago with sciatica, right side: Secondary | ICD-10-CM | POA: Diagnosis not present

## 2022-06-01 DIAGNOSIS — M5441 Lumbago with sciatica, right side: Secondary | ICD-10-CM | POA: Diagnosis not present

## 2022-06-01 DIAGNOSIS — L237 Allergic contact dermatitis due to plants, except food: Secondary | ICD-10-CM | POA: Diagnosis not present

## 2022-06-06 ENCOUNTER — Other Ambulatory Visit: Payer: Self-pay | Admitting: Student in an Organized Health Care Education/Training Program

## 2022-06-06 DIAGNOSIS — M47816 Spondylosis without myelopathy or radiculopathy, lumbar region: Secondary | ICD-10-CM | POA: Diagnosis not present

## 2022-06-13 ENCOUNTER — Ambulatory Visit
Admission: RE | Admit: 2022-06-13 | Discharge: 2022-06-13 | Disposition: A | Discharge status: Home / Self Care | Payer: BC Managed Care – PPO | Source: Ambulatory Visit | Attending: Student in an Organized Health Care Education/Training Program | Admitting: Student in an Organized Health Care Education/Training Program

## 2022-06-13 DIAGNOSIS — M47816 Spondylosis without myelopathy or radiculopathy, lumbar region: Secondary | ICD-10-CM

## 2022-06-13 DIAGNOSIS — M545 Low back pain, unspecified: Secondary | ICD-10-CM | POA: Diagnosis not present

## 2022-06-15 ENCOUNTER — Other Ambulatory Visit: Payer: 59

## 2022-08-17 DIAGNOSIS — M47816 Spondylosis without myelopathy or radiculopathy, lumbar region: Secondary | ICD-10-CM | POA: Diagnosis not present

## 2022-08-31 DIAGNOSIS — K219 Gastro-esophageal reflux disease without esophagitis: Secondary | ICD-10-CM | POA: Diagnosis not present

## 2022-08-31 DIAGNOSIS — E782 Mixed hyperlipidemia: Secondary | ICD-10-CM | POA: Diagnosis not present

## 2022-08-31 DIAGNOSIS — R030 Elevated blood-pressure reading, without diagnosis of hypertension: Secondary | ICD-10-CM | POA: Diagnosis not present

## 2022-08-31 DIAGNOSIS — J301 Allergic rhinitis due to pollen: Secondary | ICD-10-CM | POA: Diagnosis not present

## 2022-12-01 DIAGNOSIS — Z800 Family history of malignant neoplasm of digestive organs: Secondary | ICD-10-CM | POA: Diagnosis not present

## 2022-12-01 DIAGNOSIS — Z1211 Encounter for screening for malignant neoplasm of colon: Secondary | ICD-10-CM | POA: Diagnosis not present

## 2022-12-01 DIAGNOSIS — K648 Other hemorrhoids: Secondary | ICD-10-CM | POA: Diagnosis not present

## 2023-04-05 DIAGNOSIS — F418 Other specified anxiety disorders: Secondary | ICD-10-CM | POA: Diagnosis not present

## 2023-04-05 DIAGNOSIS — Z125 Encounter for screening for malignant neoplasm of prostate: Secondary | ICD-10-CM | POA: Diagnosis not present

## 2023-04-05 DIAGNOSIS — E782 Mixed hyperlipidemia: Secondary | ICD-10-CM | POA: Diagnosis not present

## 2023-04-05 DIAGNOSIS — Z0001 Encounter for general adult medical examination with abnormal findings: Secondary | ICD-10-CM | POA: Diagnosis not present

## 2023-04-05 DIAGNOSIS — J301 Allergic rhinitis due to pollen: Secondary | ICD-10-CM | POA: Diagnosis not present

## 2023-04-05 DIAGNOSIS — Z131 Encounter for screening for diabetes mellitus: Secondary | ICD-10-CM | POA: Diagnosis not present

## 2023-04-05 DIAGNOSIS — K219 Gastro-esophageal reflux disease without esophagitis: Secondary | ICD-10-CM | POA: Diagnosis not present

## 2023-07-16 IMAGING — DX DG LUMBAR SPINE COMPLETE 4+V
5 series · 5 of 5 positions shown · non-contrast
Comparison: None.

CLINICAL DATA: Severe low back pain after bending over today. The
pain is increased with movement and trying to stand.

EXAM:
LUMBAR SPINE - COMPLETE 4+ VIEW

[l-spine ap]
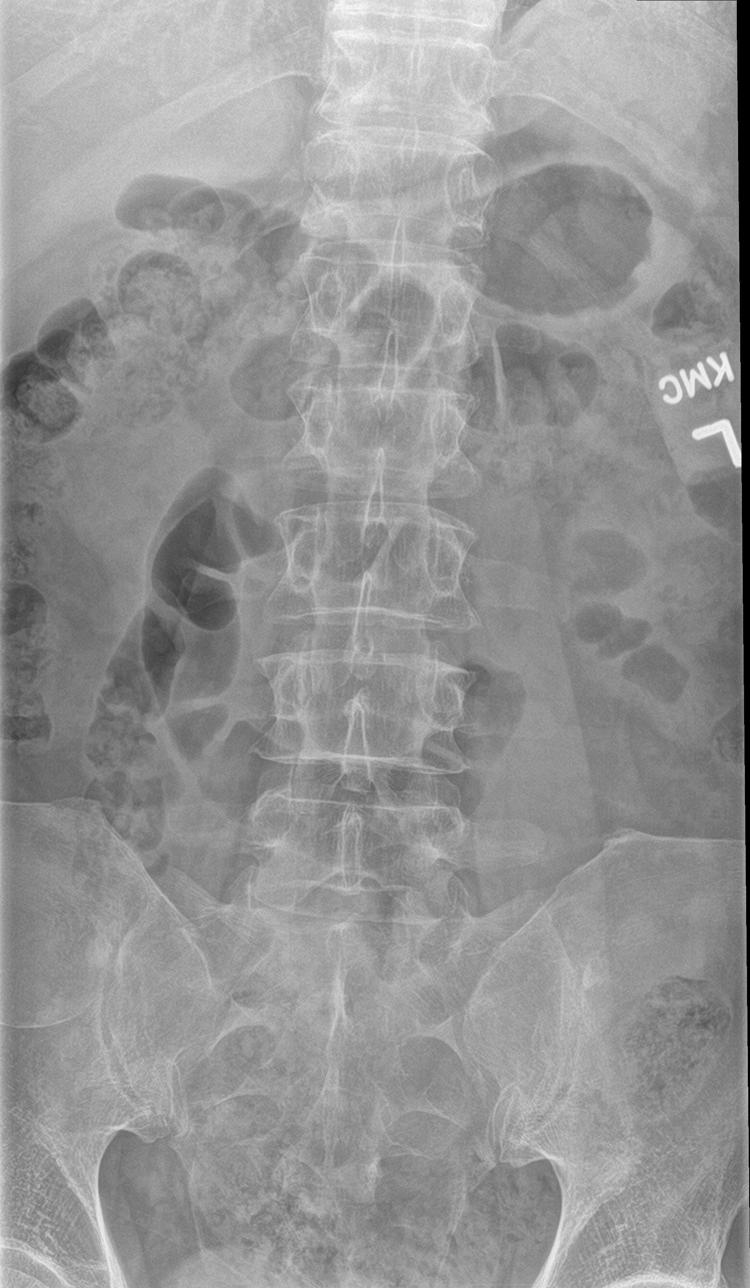

[l-spine obl (1 of 2)]
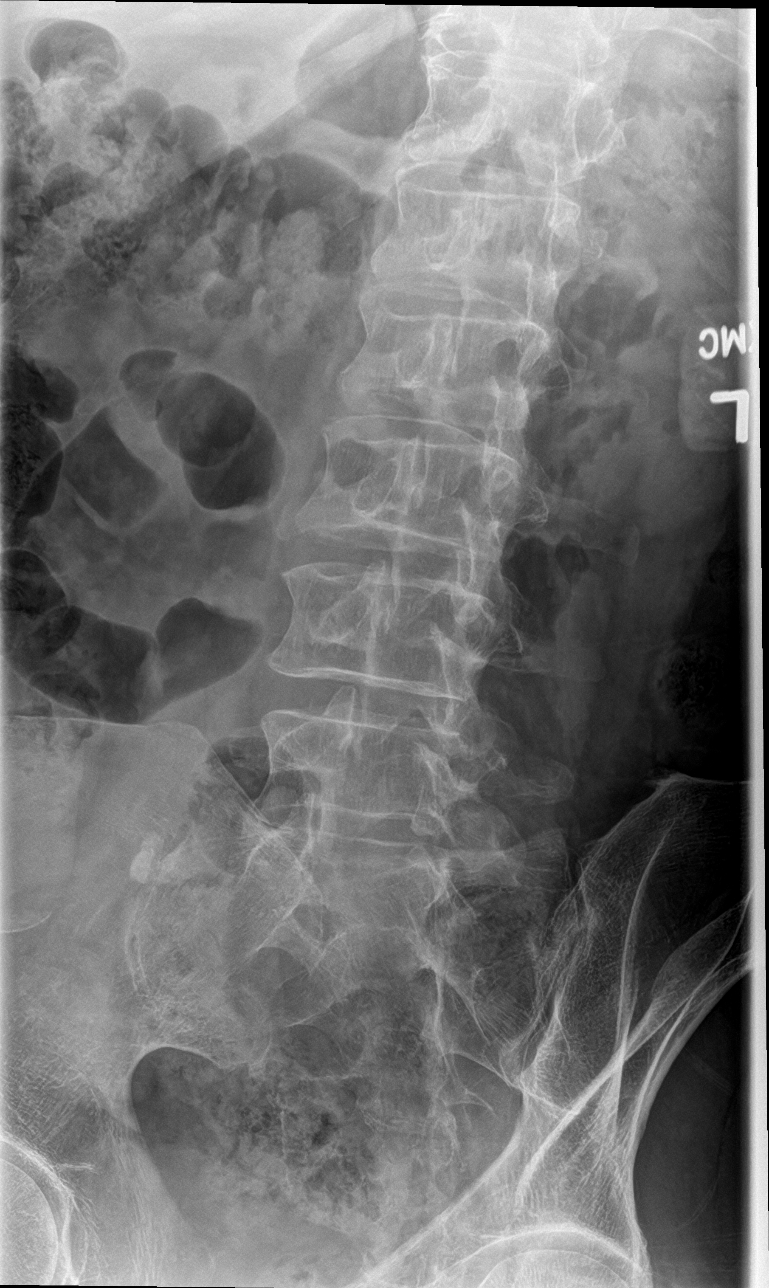

[l-spine obl (2 of 2)]
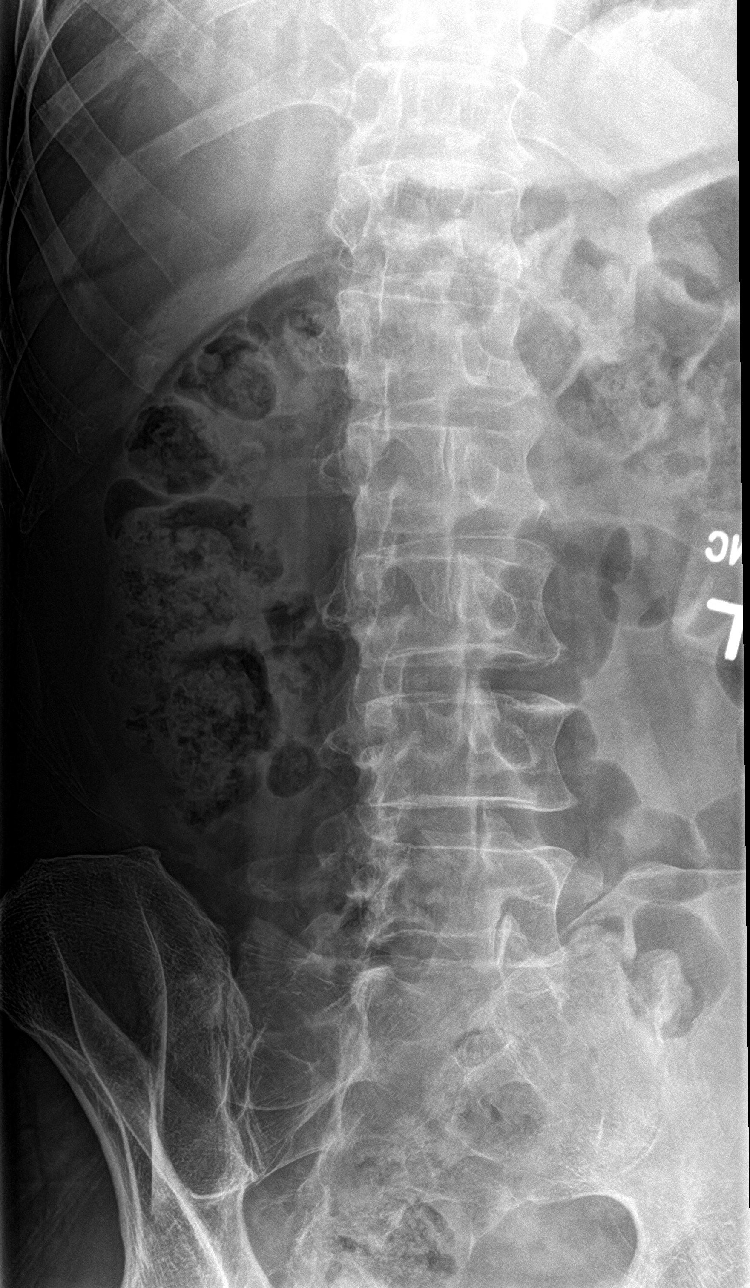

[l-spine lat]
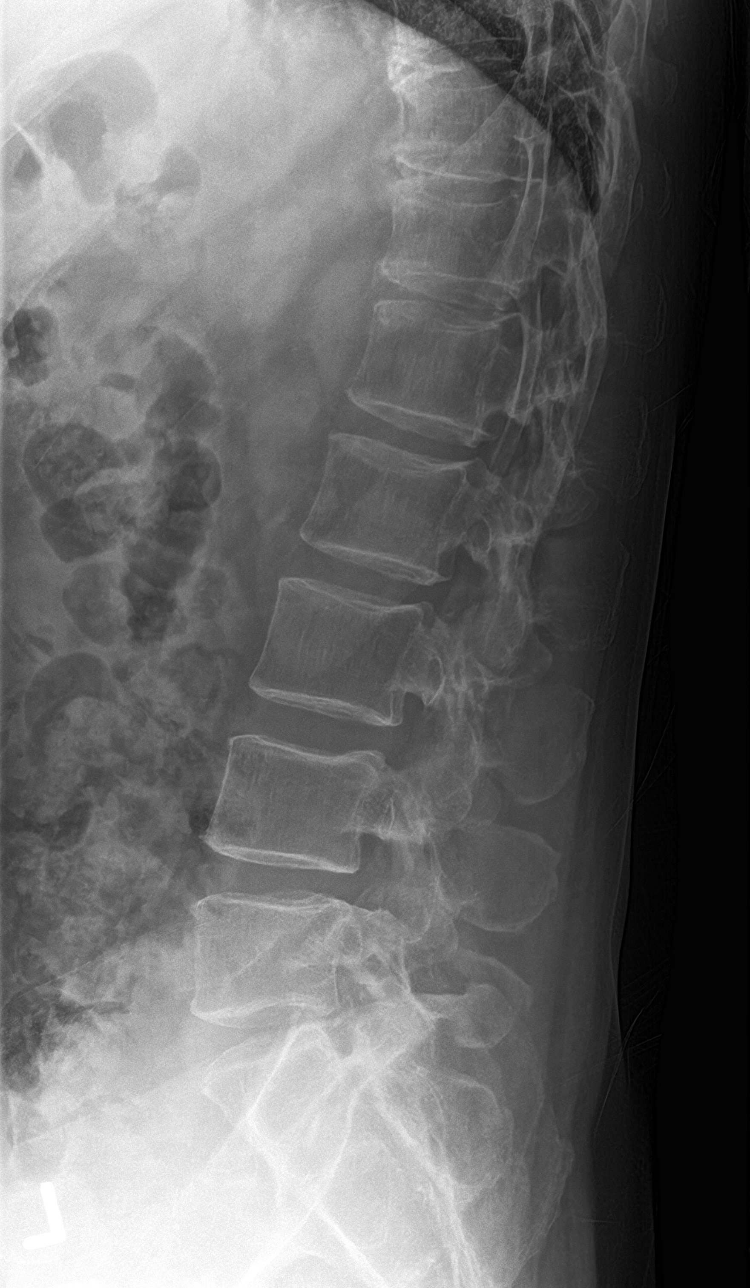

[l-spine spot]
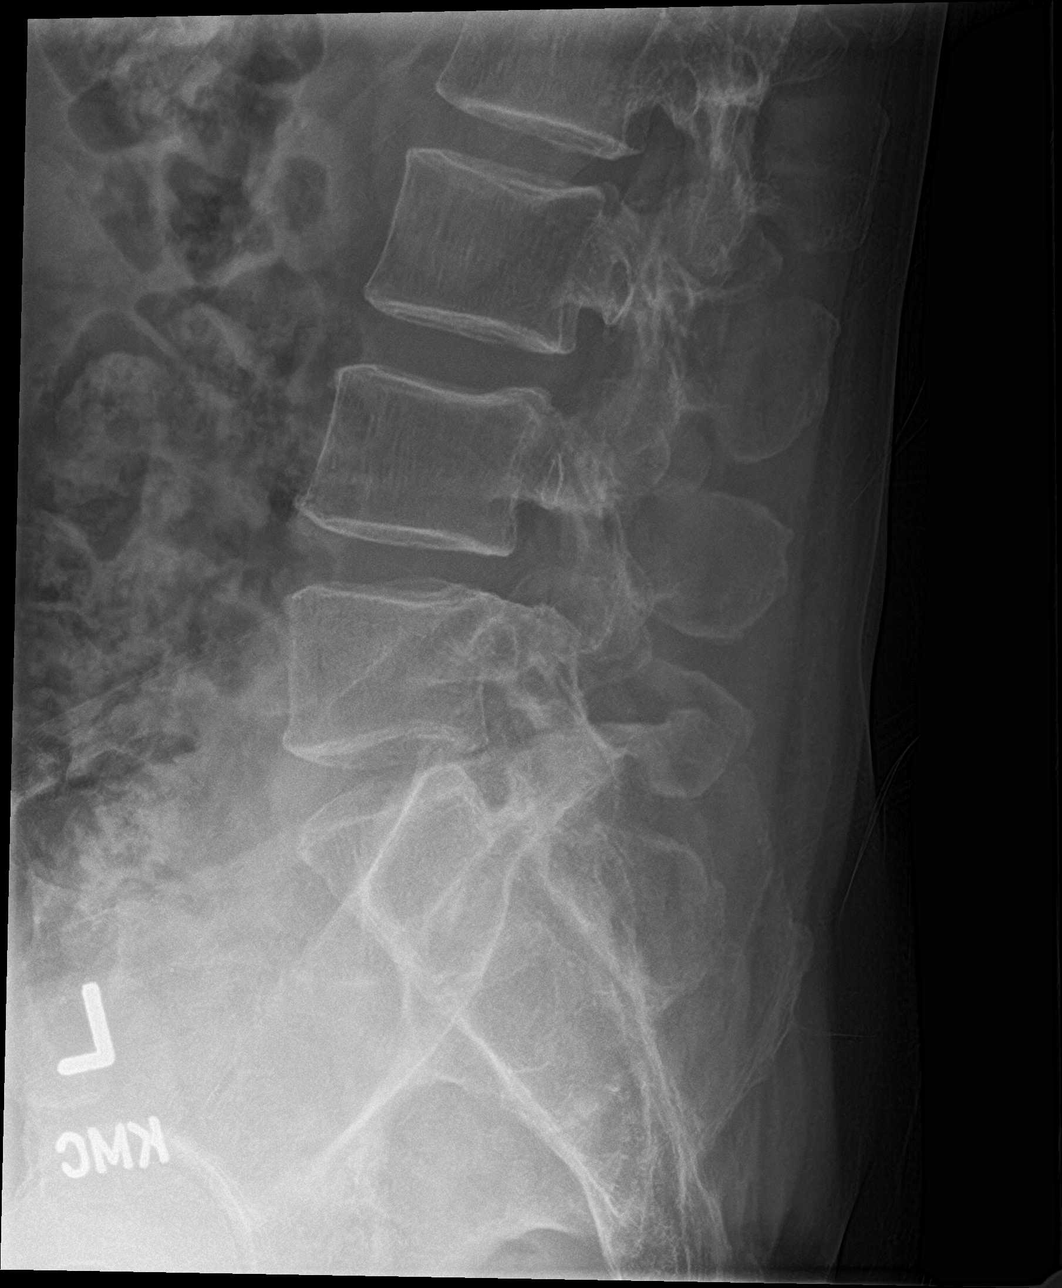

[5 of 5 positions shown; findings below may reference images not displayed]

FINDINGS: Five non-rib-bearing lumbar vertebrae. Minimal anterior spur
formation at the L4-5 level. Mild anterior spur formation at the
T10-11 and T11-12 levels. No fractures, pars defects or
subluxations.
IMPRESSION: 1. No acute abnormality.
2. Mild degenerative changes.

## 2024-02-02 DIAGNOSIS — M545 Low back pain, unspecified: Secondary | ICD-10-CM | POA: Diagnosis not present

## 2024-02-05 DIAGNOSIS — M5186 Other intervertebral disc disorders, lumbar region: Secondary | ICD-10-CM | POA: Diagnosis not present

## 2024-04-03 DIAGNOSIS — Z0001 Encounter for general adult medical examination with abnormal findings: Secondary | ICD-10-CM | POA: Diagnosis not present

## 2024-04-03 DIAGNOSIS — Z125 Encounter for screening for malignant neoplasm of prostate: Secondary | ICD-10-CM | POA: Diagnosis not present

## 2024-04-03 DIAGNOSIS — F418 Other specified anxiety disorders: Secondary | ICD-10-CM | POA: Diagnosis not present

## 2024-04-03 DIAGNOSIS — K219 Gastro-esophageal reflux disease without esophagitis: Secondary | ICD-10-CM | POA: Diagnosis not present

## 2024-04-03 DIAGNOSIS — E782 Mixed hyperlipidemia: Secondary | ICD-10-CM | POA: Diagnosis not present

## 2024-04-03 DIAGNOSIS — J301 Allergic rhinitis due to pollen: Secondary | ICD-10-CM | POA: Diagnosis not present

## 2024-04-03 DIAGNOSIS — Z131 Encounter for screening for diabetes mellitus: Secondary | ICD-10-CM | POA: Diagnosis not present

## 2024-05-08 DIAGNOSIS — R0789 Other chest pain: Secondary | ICD-10-CM | POA: Diagnosis not present

## 2024-05-29 ENCOUNTER — Other Ambulatory Visit: Payer: Self-pay | Admitting: Physician Assistant

## 2024-05-29 ENCOUNTER — Encounter: Payer: Self-pay | Admitting: Physician Assistant

## 2024-05-29 ENCOUNTER — Ambulatory Visit: Attending: Physician Assistant | Admitting: Physician Assistant

## 2024-05-29 VITALS — BP 136/82 | HR 74 | Ht 74.0 in | Wt 191.0 lb

## 2024-05-29 DIAGNOSIS — R079 Chest pain, unspecified: Secondary | ICD-10-CM

## 2024-05-29 DIAGNOSIS — E785 Hyperlipidemia, unspecified: Secondary | ICD-10-CM

## 2024-05-29 MED ORDER — METOPROLOL TARTRATE 100 MG PO TABS: 100.0000 mg | ORAL_TABLET | Freq: Once | ORAL | 3 refills | Status: DC

## 2024-05-29 NOTE — Patient Instructions (Signed)
 Medication Instructions:  NO CHANGES *If you need a refill on your cardiac medications before your next appointment, please call your pharmacy*  Lab Work: NO LABS If you have labs (blood work) drawn today and your tests are completely normal, you will receive your results only by: MyChart Message (if you have MyChart) OR A paper copy in the mail If you have any lab test that is abnormal or we need to change your treatment, we will call you to review the results.  Testing/Procedures:[address] Your physician has requested that you have an echocardiogram. Echocardiography is a painless test that uses sound waves to create images of your heart. It provides your doctor with information about the size and shape of your heart and how well your heart's chambers and valves are working. This procedure takes approximately one hour. There are no restrictions for this procedure. Please do NOT wear cologne, perfume, aftershave, or lotions (deodorant is allowed). Please arrive 15 minutes prior to your appointment time.  Please note: We ask at that you not bring children with you during ultrasound (echo/ vascular) testing. Due to room size and safety concerns, children are not allowed in the ultrasound rooms during exams. Our front office staff cannot provide observation of children in our lobby area while testing is being conducted. An adult accompanying a patient to their appointment will only be allowed in the ultrasound room at the discretion of the ultrasound technician under special circumstances. We apologize for any inconvenience.      Your cardiac CT will be scheduled at one of the below locations:   [facility] [address] [place], [place] [address] [phone]  OR   [facility] [address]  [place], [place] [address]  If scheduled at [facility], please arrive at the [facility] and Children's Entrance (Entrance C2) of [facility] 30 minutes prior to test start time. You can use the FREE valet parking offered at entrance C (encouraged to control the heart rate for the test)  Proceed to the [facility] Radiology Department (first floor) to check-in and test prep.  All radiology patients and guests should use entrance C2 at [facility], accessed from [place] [facility], even though the hospital's physical address listed is [address]  If scheduled at the Heart and Vascular Tower at [address], please enter the parking lot using the [address] entrance and use the FREE valet service at the patient drop-off area. Enter the building and check-in with registration on the main floor.    Please follow these instructions carefully (unless otherwise directed):  An IV will be required for this test and Nitroglycerin will be given.  Hold all erectile dysfunction medications at least 3 days (72 hrs) prior to test. (Ie viagra, cialis, sildenafil, tadalafil, etc)   On the Night Before the Test: Be sure to Drink plenty of water. Do not consume any caffeinated/decaffeinated beverages or chocolate 12 hours prior to your test. Do not take any antihistamines 12 hours prior to your test.  On the Day of the Test: Drink plenty of water until 1 hour prior to the test. Do not eat any food 1 hour prior to test. You may take your regular medications prior to the test.  Take metoprolol  (Lopressor ) 100 MG ONE TIME DOSE two hours prior to test  After the Test: Drink plenty of water. After receiving IV contrast, you may experience a mild flushed feeling.  This is normal. On occasion, you may experience a mild rash up to 24 hours after the test. This is not dangerous. If this occurs, you can take Benadryl 25 mg, Zyrtec, Claritin, or Allegra and increase your fluid intake. (Patients taking Tikosyn should avoid Benadryl, and may take Zyrtec, Claritin, or Allegra) If you experience trouble breathing,  this can be serious. If it is severe call 911 IMMEDIATELY. If it is mild, please call our office.  We will call to schedule your test 2-4 weeks out understanding that some insurance companies will need an authorization prior to the service being performed.   For more information and frequently asked questions, please visit our website : [email or web address]  For non-scheduling related questions, please contact the cardiac imaging nurse navigator should you have any questions/concerns: Cardiac Imaging Nurse Navigators Direct Office Dial: [phone]   For scheduling needs, including cancellations and rescheduling, please call [place], [phone].  Follow-Up: At [facility], you and your health needs are our priority.  As part of our continuing mission to provide you with exceptional heart care, our providers are all part of one team.  This team includes your primary Cardiologist (physician) and Advanced Practice Providers or APPs (Physician Assistants and Nurse Practitioners) who all work together to provide you with the care you need, when you need it.  Your next appointment:   6 week(s)  Provider:   Scot Ford, PA-C   We recommend signing up for the patient portal called MyChart.  Sign up information is provided on this After Visit Summary.  MyChart is used to connect with patients for Virtual Visits (Telemedicine).  Patients are able to view lab/test results, encounter notes, upcoming appointments, etc.  Non-urgent messages can be sent to your provider as well.   To learn more about what you can do with MyChart, go to [email or web address].

## 2024-05-29 NOTE — Progress Notes (Signed)
  Cardiology Office Note   Date:  05/29/2024  ID:  Troy Goodwin, DOB May 03, 1961, MRN [id number] PCP: Catalina Bare, MD  [facility] Providers Cardiologist:  [facility] - Dr. Ladona  History of Present Illness Troy Goodwin is a 63 y.o. male with past medical history of hyperlipidemia not on statin who was recently seen by PCP on 05/08/2024 for evaluation of intermittent left-sided chest discomfort.  Symptom has been going on for some time.  Most recent lipid panel obtained in June 2025 showed total cholesterol 209, LDL 135, HDL 57, triglycerides 78.  Creatinine was 0.93.  Hemoglobin 15.0.  TSH normal 2.17.  Hemoglobin A1c 5.5.  Patient presents today for evaluation of intermittent chest discomfort that has been going on for the past 6 months.  He does a lot of international travels.  He has never developed blood clots in the past.  He denies any leg swelling.  He has been noticing increasing dyspnea along with the chest discomfort.  Interestingly his chest discomfort does not correlate with the degree of exertion and can happen both at rest and with exertion.  Symptom occurs about 4-5 times a week.  He denies any family history of heart issue.  Both parents and 2 of his sister passed away with cancer.  Only remaining sister is a cancer survivor.  Fortunately, he himself has never been diagnosed with cancer.  His EKG is normal.  Given his symptom, I recommended echocardiogram and a coronary CTA.  He will need a single tablet of metoprolol  tartrate 100 mg prior to the coronary CTA.  I will see the patient back in 6 weeks for reassessment.  ROS:   Patient complains of chest pain, he denies any shortness of breath, lower extremity edema, orthopnea or PND.  Studies Reviewed          Risk Assessment/Calculations           Physical Exam VS:  BP 136/82 (BP Location: Left Arm, Patient Position: Sitting, Cuff Size: Normal)   Pulse 74   Ht 6' 2 (1.88 m)   Wt 191 lb (86.6 kg)    SpO2 98%   BMI 24.52 kg/m        Wt Readings from Last 3 Encounters:  05/29/24 191 lb (86.6 kg)  05/14/21 185 lb (83.9 kg)  04/20/15 177 lb (80.3 kg)    GEN: Well nourished, well developed in no acute distress NECK: No JVD; No carotid bruits CARDIAC: RRR, no murmurs, rubs, gallops RESPIRATORY:  Clear to auscultation without rales, wheezing or rhonchi  ABDOMEN: Soft, non-tender, non-distended EXTREMITIES:  No edema; No deformity   ASSESSMENT AND PLAN  Chest discomfort: Symptom does not have clear correlation with exertion.  Risk factors including age and hyperlipidemia.  I recommended echocardiogram and coronary CTA to further assess  Hyperlipidemia: Currently not on any statin medication, however if there is evidence of coronary artery disease or high calcium score, a statin medication would be warranted.       Dispo: Follow-up in 6 weeks  Signed, Seynabou Fults, PA

## 2024-05-31 NOTE — Addendum Note (Signed)
 Addended byBETHA FORD, Eulis Salazar on: 05/31/2024 09:30 AM   Modules accepted: Level of Service

## 2024-06-03 ENCOUNTER — Telehealth (HOSPITAL_COMMUNITY): Payer: Self-pay | Admitting: *Deleted

## 2024-06-03 NOTE — Telephone Encounter (Signed)
 Attempted to call patient regarding upcoming cardiac CT appointment. Left message on voicemail with name and callback number  Larey Brick RN Navigator Cardiac Imaging [facility] [phone] Office [phone] Cell

## 2024-06-04 ENCOUNTER — Ambulatory Visit (HOSPITAL_COMMUNITY)
Admission: RE | Admit: 2024-06-04 | Discharge: 2024-06-04 | Disposition: A | Discharge status: Home / Self Care | Source: Ambulatory Visit | Attending: Cardiovascular Disease | Admitting: Cardiovascular Disease

## 2024-06-04 DIAGNOSIS — R079 Chest pain, unspecified: Secondary | ICD-10-CM | POA: Diagnosis not present

## 2024-06-04 MED ORDER — IOHEXOL 350 MG/ML SOLN
100.0000 mL | Freq: Once | INTRAVENOUS | Status: AC | PRN
  Administered 2024-06-04: 100 mL via INTRAVENOUS

## 2024-06-04 MED ORDER — NITROGLYCERIN 0.4 MG SL SUBL
0.8000 mg | SUBLINGUAL_TABLET | Freq: Once | SUBLINGUAL | Status: AC
  Administered 2024-06-04: 0.8 mg via SUBLINGUAL

## 2024-06-05 ENCOUNTER — Ambulatory Visit: Payer: Self-pay | Admitting: Physician Assistant

## 2024-07-08 ENCOUNTER — Ambulatory Visit (HOSPITAL_COMMUNITY)
Admission: RE | Admit: 2024-07-08 | Discharge: 2024-07-08 | Disposition: A | Discharge status: Home / Self Care | Source: Ambulatory Visit | Attending: Internal Medicine | Admitting: Internal Medicine

## 2024-07-08 DIAGNOSIS — R079 Chest pain, unspecified: Secondary | ICD-10-CM | POA: Insufficient documentation

## 2024-07-08 LAB — ECHOCARDIOGRAM COMPLETE
Area-P 1/2: 4.86 cm2
P 1/2 time: 1519 ms
S' Lateral: 3.3 cm

## 2024-07-22 ENCOUNTER — Encounter: Payer: Self-pay | Admitting: Physician Assistant

## 2024-07-22 ENCOUNTER — Ambulatory Visit: Attending: Physician Assistant | Admitting: Physician Assistant

## 2024-07-22 VITALS — BP 132/72 | HR 73 | Ht 74.0 in | Wt 195.6 lb

## 2024-07-22 DIAGNOSIS — E785 Hyperlipidemia, unspecified: Secondary | ICD-10-CM | POA: Diagnosis not present

## 2024-07-22 DIAGNOSIS — R079 Chest pain, unspecified: Secondary | ICD-10-CM | POA: Diagnosis not present

## 2024-07-22 MED ORDER — ASPIRIN 81 MG PO TBEC: 81.0000 mg | DELAYED_RELEASE_TABLET | Freq: Every day | ORAL | 3 refills | Status: AC

## 2024-07-22 NOTE — Patient Instructions (Signed)
 Medication Instructions:  Start Aspirin  81 mg take one tablet daily *If you need a refill on your cardiac medications before your next appointment, please call your pharmacy*  Lab Work: Fasting Lipids in six months If you have labs (blood work) drawn today and your tests are completely normal, you will receive your results only by: MyChart Message (if you have MyChart) OR A paper copy in the mail If you have any lab test that is abnormal or we need to change your treatment, we will call you to review the results.  Follow-Up: At [facility], you and your health needs are our priority.  As part of our continuing mission to provide you with exceptional heart care, our providers are all part of one team.  This team includes your primary Cardiologist (physician) and Advanced Practice Providers or APPs (Physician Assistants and Nurse Practitioners) who all work together to provide you with the care you need, when you need it.  Your next appointment:   6 month(s)  Provider:   Gordy Bergamo, MD    We recommend signing up for the patient portal called MyChart.  Sign up information is provided on this After Visit Summary.  MyChart is used to connect with patients for Virtual Visits (Telemedicine).  Patients are able to view lab/test results, encounter notes, upcoming appointments, etc.  Non-urgent messages can be sent to your provider as well.   To learn more about what you can do with MyChart, go to [email or web address].

## 2024-07-22 NOTE — Progress Notes (Signed)
 Cardiology Office Note   Date:  07/22/2024  ID:  COHL BEHRENS, DOB [birth year], MRN [id number] PCP: Catalina Bare, MD  [facility] Providers Cardiologist:  Gordy Bergamo, MD  -- [facility] assigned MD    History of Present Illness Troy Goodwin is a 63 y.o. male with past medical history of hyperlipidemia not on statin who was recently seen by PCP on 05/08/2024 for evaluation of intermittent left-sided chest discomfort. Symptom has been going on for some time. Most recent lipid panel obtained in June 2025 showed total cholesterol 209, LDL 135, HDL 57, triglycerides 78. Creatinine was 0.93. Hemoglobin 15.0. TSH normal 2.17. Hemoglobin A1c 5.5.  I last saw the patient in July for intermittent chest discomfort for the past 6 months.  EKG was normal.  I recommended coronary CTA and echocardiogram.  Subsequent coronary CTA obtained on 06/04/2024 showed no significant extracardiac started finding, minimal plaque in the left main and LAD territory.  Coronary calcium score was 49.7 which placed the patient at 52nd percentile for age and sex matched control.  Subsequent echocardiogram obtained on 07/08/2024 showed EF 55 to 60%, grade 1 DD, trivial MR, mild AI.  Patient presents today for follow-up.  He still occasionally has some chest discomfort.  I reviewed the recent coronary CTA and echocardiogram.  Result is quite reassuring.  I recommended continued observation.  I will add aspirin  to his medication.  Recent lipid panel showed LDL greater than 130s.  I recommended adding a low-dose statin medication, however patient wished to try a trial of diet and exercise.  He will return in 6 months to see Dr. Bergamo and get a fasting lipid panel prior to that.   ROS:   Patient continues to have intermittent chest pain but no shortness of breath, lower extremity edema, orthopnea or PND.  Studies Reviewed      Cardiac Studies & Procedures    ______________________________________________________________________________________________     ECHOCARDIOGRAM  ECHOCARDIOGRAM COMPLETE 07/08/2024  Narrative ECHOCARDIOGRAM REPORT    Patient Name:   Troy Goodwin Date of Exam: 07/08/2024 Medical Rec #:  [id number]      Height:       74.0 in Accession #:    [id number]     Weight:       191.0 lb Date of Birth:  April 03, 1961      BSA:          2.131 m Patient Age:    62 years       BP:           152/83 mmHg Patient Gender: M              HR:           62 bpm. Exam Location:  [address]  Procedure: 2D Echo, 3D Echo, Cardiac Doppler, Color Doppler and Strain Analysis (Both Spectral and Color Flow Doppler were utilized during procedure).  Indications:    R07.9 Chest Pain  History:        Patient has no prior history of Echocardiogram examinations. Signs/Symptoms:Dyspnea; Risk Factors:HLD.  Sonographer:    Waldo Guadalajara RCS Referring Phys: [phone] Deisha Stull  IMPRESSIONS   1. Left ventricular ejection fraction, by estimation, is 55 to 60%. Left ventricular ejection fraction by 3D volume is 56 %. The left ventricle has normal function. The left ventricle has no regional wall motion abnormalities. Left ventricular diastolic parameters are consistent with Grade I diastolic dysfunction (impaired relaxation). The average left ventricular global longitudinal strain  is -20.9 %. The global longitudinal strain is normal. 2. Right ventricular systolic function is normal. The right ventricular size is normal. There is normal pulmonary artery systolic pressure. 3. The mitral valve is normal in structure. Trivial mitral valve regurgitation. No evidence of mitral stenosis. 4. The aortic valve is tricuspid. Aortic valve regurgitation is mild. No aortic stenosis is present. 5. The inferior vena cava is normal in size with greater than 50% respiratory variability, suggesting right atrial pressure of 3 mmHg.  FINDINGS Left Ventricle: Left  ventricular ejection fraction, by estimation, is 55 to 60%. Left ventricular ejection fraction by 3D volume is 56 %. The left ventricle has normal function. The left ventricle has no regional wall motion abnormalities. The average left ventricular global longitudinal strain is -20.9 %. Strain was performed and the global longitudinal strain is normal. The left ventricular internal cavity size was normal in size. There is no left ventricular hypertrophy. Left ventricular diastolic parameters are consistent with Grade I diastolic dysfunction (impaired relaxation). Normal left ventricular filling pressure.  Right Ventricle: The right ventricular size is normal. No increase in right ventricular wall thickness. Right ventricular systolic function is normal. There is normal pulmonary artery systolic pressure. The tricuspid regurgitant velocity is 2.37 m/s, and with an assumed right atrial pressure of 3 mmHg, the estimated right ventricular systolic pressure is 25.5 mmHg.  Left Atrium: Left atrial size was normal in size.  Right Atrium: Right atrial size was normal in size.  Pericardium: There is no evidence of pericardial effusion.  Mitral Valve: The mitral valve is normal in structure. Trivial mitral valve regurgitation. No evidence of mitral valve stenosis.  Tricuspid Valve: The tricuspid valve is normal in structure. Tricuspid valve regurgitation is trivial. No evidence of tricuspid stenosis.  Aortic Valve: The aortic valve is tricuspid. Aortic valve regurgitation is mild. Aortic regurgitation PHT measures 1519 msec. No aortic stenosis is present.  Pulmonic Valve: The pulmonic valve was normal in structure. Pulmonic valve regurgitation is trivial. No evidence of pulmonic stenosis.  Aorta: The aortic root is normal in size and structure.  Venous: The inferior vena cava is normal in size with greater than 50% respiratory variability, suggesting right atrial pressure of 3 mmHg.  IAS/Shunts: No  atrial level shunt detected by color flow Doppler.  Additional Comments: 3D was performed not requiring image post processing on an independent workstation and was normal.   LEFT VENTRICLE PLAX 2D LVIDd:         4.90 cm         Diastology LVIDs:         3.30 cm         LV e' medial:    9.46 cm/s LV PW:         0.90 cm         LV E/e' medial:  8.3 LV IVS:        0.90 cm         LV e' lateral:   8.81 cm/s LVOT diam:     2.10 cm         LV E/e' lateral: 8.9 LV SV:         77 LV SV Index:   36              2D Longitudinal LVOT Area:     3.46 cm        Strain 2D Strain GLS   -20.7 % (A4C): 2D Strain GLS   -21.3 % (A3C): 2D Strain  GLS   -20.6 % (A2C): 2D Strain GLS   -20.9 % Avg:  3D Volume EF LV 3D EF:    Left ventricul ar ejection fraction by 3D volume is 56 %.  3D Volume EF: 3D EF:        56 % LV EDV:       116 ml LV ESV:       51 ml LV SV:        64 ml  RIGHT VENTRICLE RV Basal diam:  3.30 cm RV S prime:     12.60 cm/s TAPSE (M-mode): 2.1 cm RVSP:           25.5 mmHg  LEFT ATRIUM             Index        RIGHT ATRIUM           Index LA diam:        3.80 cm 1.78 cm/m   RA Pressure: 3.00 mmHg LA Vol (A2C):   34.9 ml 16.37 ml/m  RA Area:     12.20 cm LA Vol (A4C):   42.6 ml 19.99 ml/m  RA Volume:   28.50 ml  13.37 ml/m LA Biplane Vol: 40.1 ml 18.81 ml/m AORTIC VALVE LVOT Vmax:   95.80 cm/s LVOT Vmean:  68.300 cm/s LVOT VTI:    0.221 m AI PHT:      1519 msec  AORTA Ao Root diam: 3.10 cm Ao Asc diam:  3.40 cm  MITRAL VALVE               TRICUSPID VALVE MV Area (PHT):             TR Peak grad:   22.5 mmHg MV Decel Time:             TR Vmax:        237.00 cm/s MV E velocity: 78.60 cm/s  Estimated RAP:  3.00 mmHg MV A velocity: 91.20 cm/s  RVSP:           25.5 mmHg MV E/A ratio:  0.86 SHUNTS Systemic VTI:  0.22 m Systemic Diam: 2.10 cm  Annabella Scarce MD Electronically signed by Annabella Scarce MD Signature Date/Time: 07/08/2024/1:50:33  PM    Final      CT SCANS  CT CORONARY MORPH W/CTA COR W/SCORE 06/04/2024  Addendum 06/14/2024 11:37 AM ADDENDUM REPORT: 06/14/2024 11:35  EXAM: OVER-READ INTERPRETATION  CT CHEST  The following report is an over-read performed by radiologist Dr. Andrea Gasman of [facility] on 06/14/2024. This over-read does not include interpretation of cardiac or coronary anatomy or pathology. The coronary CTA interpretation by the cardiologist is attached.  COMPARISON:  Liver MRI 12/03/2015 reviewed  FINDINGS: Vascular: No aortic atherosclerosis. The included aorta is normal in caliber.  Mediastinum/nodes: Calcified mediastinal lymph nodes consistent with prior granulomatous disease. Unremarkable esophagus.  Lungs: No focal airspace disease. Benign calcified granuloma in the right lung. No pleural fluid. The included airways are patent.  Upper abdomen: No acute findings. Benign cyst in the left lobe of the liver. Additional subcentimeter hypodensities are too small to characterize, but also likely small cysts.  Musculoskeletal: There are no acute or suspicious osseous abnormalities. Benign bone island in left rib.  IMPRESSION: 1. No acute extracardiac findings. 2. Sequela of prior granulomatous disease.   Electronically Signed By: Andrea Gasman M.D. On: 06/14/2024 11:35  Narrative CLINICAL DATA:  Chest pain  EXAM: Cardiac/Coronary CTA  TECHNIQUE: A non-contrast, gated CT  scan was obtained with axial slices of 2.5 mm through the heart for calcium scoring. Calcium scoring was performed using the Agatston method. A 120 kV prospective, gated, contrast cardiac CT scan was obtained. Gantry rotation speed was 230 msec and collimation was 0.63 mm. Two sublingual nitroglycerin  tablets (0.8 mg) were given. The 3D data set was reconstructed with motion correction for the best systolic or diastolic phase. Images were analyzed on a dedicated workstation using  MPR, MIP, and VRT modes. The patient received 95 cc of contrast.  FINDINGS: Image quality: Excellent.  Noise artifact is: Limited.  Coronary Arteries:  Normal coronary origin.  Right dominance.  Left main: The left main is a large caliber vessel with a normal take off from the left coronary cusp that bifurcates to form a left anterior descending artery and a left circumflex artery. There is minimal calcified plaque (<25%).  Left anterior descending artery: The mid LAD contains minimal mixed density plaque (<25%). The LAD gives off 1 patent diagonal branch.  Left circumflex artery: The LCX is non-dominant and patent with no evidence of plaque or stenosis. The LCX gives off 3 patent obtuse marginal branches.  Right coronary artery: The RCA is dominant with normal take off from the right coronary cusp. There is no evidence of plaque or stenosis. The RCA terminates as a PDA and right posterolateral branch without evidence of plaque or stenosis.  Right Atrium: Right atrial size is within normal limits.  Right Ventricle: The right ventricular cavity is within normal limits.  Left Atrium: Left atrial size is normal in size with no left atrial appendage filling defect. Accessory LAA noted.  Left Ventricle: The ventricular cavity size is within normal limits.  Pulmonary arteries: Normal in size.  Pulmonary veins: Normal pulmonary venous drainage.  Pericardium: Normal thickness without significant effusion or calcium present.  Cardiac valves: The aortic valve is trileaflet without significant calcification. The mitral valve is normal without significant calcification.  Aorta: Normal caliber without significant disease.  Extra-cardiac findings: See attached radiology report for non-cardiac structures.  IMPRESSION: 1. Coronary calcium score of 49.7. This was 52nd percentile for age-, sex, and race-matched controls.  2. Total plaque volume 58 mm3 which is 15th percentile  for age- and sex-matched controls (calcified plaque 6 mm3; non-calcified plaque 52 mm3). TPV is mild.  3. Normal coronary origin with right dominance.  4. Minimal plaque in the mid LAD (<25%).  RECOMMENDATIONS: 1. CAD-RADS 1: Minimal non-obstructive CAD (0-24%). Consider non-atherosclerotic causes of chest pain. Consider preventive therapy and risk factor modification.  Darryle Decent, MD  Electronically Signed: By: Darryle Decent M.D. On: 06/05/2024 11:34     ______________________________________________________________________________________________      Risk Assessment/Calculations           Physical Exam VS:  BP 132/72   Pulse 73   Ht 6' 2 (1.88 m)   Wt 195 lb 9.6 oz (88.7 kg)   SpO2 95%   BMI 25.11 kg/m        Wt Readings from Last 3 Encounters:  07/22/24 195 lb 9.6 oz (88.7 kg)  05/29/24 191 lb (86.6 kg)  05/14/21 185 lb (83.9 kg)    GEN: Well nourished, well developed in no acute distress NECK: No JVD; No carotid bruits CARDIAC: RRR, no murmurs, rubs, gallops RESPIRATORY:  Clear to auscultation without rales, wheezing or rhonchi  ABDOMEN: Soft, non-tender, non-distended EXTREMITIES:  No edema; No deformity   ASSESSMENT AND PLAN  Chest pain: Uncertain etiology.  Recent coronary CTA  showed minimal plaque less than 25% disease in left main and mid LAD.  No significant coronary artery disease to explain his symptoms.  He denies any significant shortness of breath.  Suspicion for PE fairly low.  Symptom does not have clear correlation with physical activity.  Will continue monitoring.  Add 81 mg aspirin   Hyperlipidemia: Based on referral note under media section of chart review, last lipid panel was obtained in June, LDL was in the 130s.  I discussed the potential possibility of starting a statin medication, however patient wished to do a trial of diet and exercise first.  Will repeat fasting lipid panel prior to next visit in 6 months.  If LDL is still  uncontrolled, will start on statin therapy.        Dispo: Follow-up in 6 months, fasting lipid panel prior to the next visit.  Signed, Scot Ford, PA
